# Patient Record
Sex: Male | Born: 1985 | Race: Black or African American | Hispanic: No | Marital: Single | Smoking: Never smoker
Health system: Southern US, Community
[De-identification: ages and names within clinical notes are randomized; demographics above are authoritative.]

## PROBLEM LIST (undated history)

## (undated) ENCOUNTER — Ambulatory Visit: Admission: EM | Payer: No Typology Code available for payment source | Source: Home / Self Care

## (undated) DIAGNOSIS — W3400XA Accidental discharge from unspecified firearms or gun, initial encounter: Secondary | ICD-10-CM

## (undated) HISTORY — PX: NO PAST SURGERIES: SHX2092

---
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
## 2004-02-16 ENCOUNTER — Emergency Department (HOSPITAL_COMMUNITY): Admission: EM | Admit: 2004-02-16 | Discharge: 2004-02-16 | Payer: Self-pay | Admitting: Emergency Medicine

## 2005-12-12 ENCOUNTER — Emergency Department (HOSPITAL_COMMUNITY): Admission: EM | Admit: 2005-12-12 | Discharge: 2005-12-12 | Payer: Self-pay | Admitting: Emergency Medicine

## 2009-07-17 ENCOUNTER — Emergency Department (HOSPITAL_COMMUNITY): Admission: EM | Admit: 2009-07-17 | Discharge: 2009-07-18 | Payer: Self-pay | Admitting: Emergency Medicine

## 2009-11-07 ENCOUNTER — Emergency Department (HOSPITAL_COMMUNITY): Admission: EM | Admit: 2009-11-07 | Discharge: 2009-11-07 | Payer: Self-pay | Admitting: Emergency Medicine

## 2011-03-21 LAB — GC/CHLAMYDIA PROBE AMP, GENITAL
Chlamydia, DNA Probe: POSITIVE — AB
GC Probe Amp, Genital: NEGATIVE

## 2011-03-25 LAB — GC/CHLAMYDIA PROBE AMP, GENITAL
Chlamydia, DNA Probe: NEGATIVE
GC Probe Amp, Genital: POSITIVE — AB

## 2011-09-26 ENCOUNTER — Emergency Department (HOSPITAL_COMMUNITY)
Admission: EM | Admit: 2011-09-26 | Discharge: 2011-09-26 | Disposition: A | Discharge status: Home / Self Care | Payer: Self-pay | Attending: Emergency Medicine | Admitting: Emergency Medicine

## 2011-09-26 DIAGNOSIS — L0231 Cutaneous abscess of buttock: Secondary | ICD-10-CM | POA: Insufficient documentation

## 2011-09-26 DIAGNOSIS — L03317 Cellulitis of buttock: Secondary | ICD-10-CM | POA: Insufficient documentation

## 2011-09-26 DIAGNOSIS — IMO0001 Reserved for inherently not codable concepts without codable children: Secondary | ICD-10-CM | POA: Insufficient documentation

## 2011-09-29 LAB — CULTURE, ROUTINE-ABSCESS

## 2015-11-23 ENCOUNTER — Encounter (HOSPITAL_COMMUNITY): Payer: Self-pay | Admitting: *Deleted

## 2015-11-23 ENCOUNTER — Emergency Department (HOSPITAL_COMMUNITY): Payer: Self-pay

## 2015-11-23 ENCOUNTER — Emergency Department (HOSPITAL_COMMUNITY)
Admission: EM | Admit: 2015-11-23 | Discharge: 2015-11-23 | Discharge status: Against Medical Advice | Payer: Self-pay | Attending: Emergency Medicine | Admitting: Emergency Medicine

## 2015-11-23 DIAGNOSIS — M7989 Other specified soft tissue disorders: Secondary | ICD-10-CM

## 2015-11-23 LAB — BASIC METABOLIC PANEL WITH GFR
Anion gap: 8 (ref 5–15)
BUN: 16 mg/dL (ref 6–20)
CO2: 28 mmol/L (ref 22–32)
Calcium: 9.5 mg/dL (ref 8.9–10.3)
Chloride: 104 mmol/L (ref 101–111)
Creatinine, Ser: 1.29 mg/dL — ABNORMAL HIGH (ref 0.61–1.24)
GFR calc Af Amer: >60 mL/min
GFR calc non Af Amer: >60 mL/min
Glucose, Bld: 95 mg/dL (ref 65–99)
Potassium: 4.3 mmol/L (ref 3.5–5.1)
Sodium: 140 mmol/L (ref 135–145)

## 2015-11-23 LAB — CBC WITH DIFFERENTIAL/PLATELET
Basophils Absolute: 0 K/uL (ref 0.0–0.1)
Basophils Relative: 0 %
Eosinophils Absolute: 0.2 K/uL (ref 0.0–0.7)
Eosinophils Relative: 3 %
HCT: 43.5 % (ref 39.0–52.0)
Hemoglobin: 14.4 g/dL (ref 13.0–17.0)
Lymphocytes Relative: 41 %
Lymphs Abs: 2.4 K/uL (ref 0.7–4.0)
MCH: 30.8 pg (ref 26.0–34.0)
MCHC: 33.1 g/dL (ref 30.0–36.0)
MCV: 93.1 fL (ref 78.0–100.0)
Monocytes Absolute: 0.3 K/uL (ref 0.1–1.0)
Monocytes Relative: 5 %
Neutro Abs: 3 K/uL (ref 1.7–7.7)
Neutrophils Relative %: 51 %
Platelets: 198 K/uL (ref 150–400)
RBC: 4.67 MIL/uL (ref 4.22–5.81)
RDW: 13.2 % (ref 11.5–15.5)
WBC: 5.9 K/uL (ref 4.0–10.5)

## 2015-11-23 NOTE — ED Notes (Signed)
Went in to round and pt has eloped. MD notified.

## 2015-11-23 NOTE — ED Provider Notes (Signed)
Patient seen/examined in the Emergency Department in conjunction with Midlevel Provider  Patient reports LE swelling Exam : awake/alert, +edema in left LE Plan: DVT study pending Pt otherwise stable    Zadie Rhineonald Yaretsi Humphres, MD 11/23/15 1154

## 2015-11-23 NOTE — ED Provider Notes (Signed)
CSN: [id number]     Arrival date & time 11/23/15  9562 History   First MD Initiated Contact with Patient 11/23/15 1003     Chief Complaint  Patient presents with  . Leg Swelling     (Consider location/radiation/quality/duration/timing/severity/associated sxs/prior Treatment) HPI   Kaya Klausing is a 29 y.o M with no sequela past medical history who presents to the emergency department today complaining of actually swelling. Patient states that he was easily incarcerated and released 2-1/2 weeks ago. Since then he has been eating fast food for every meal. He has noted left lotion recently swelling mostly around his calf and lower ankle over the last 2 weeks. The swelling has gotten progressively worse. Extremity is not painful. Denies chest pain, shortness of breath, discoloration, pale or cool extremity, nausea, vomiting, diarrhea, abdominal pain.   History reviewed. No pertinent past medical history. History reviewed. No pertinent past surgical history. No family history on file. Social History  Substance Use Topics  . Smoking status: Never Smoker   . Smokeless tobacco: Never Used  . Alcohol Use: 2.4 oz/week    4 Shots of liquor per week    Review of Systems  All other systems reviewed and are negative.     Allergies  Review of patient's allergies indicates no known allergies.  Home Medications   Prior to Admission medications   Not on File   BP 117/72 mmHg  Pulse 68  Temp(Src) 98.1 F (36.7 C) (Oral)  Resp 18  Ht 6' (1.829 m)  Wt 104.327 kg  BMI 31.19 kg/m2  SpO2 100% Physical Exam  Constitutional: He is oriented to person, place, and time. He appears well-developed and well-nourished. No distress.  HENT:  Head: Normocephalic and atraumatic.  Mouth/Throat: No oropharyngeal exudate.  Eyes: Conjunctivae and EOM are normal. Pupils are equal, round, and reactive to light. Right eye exhibits no discharge. Left eye exhibits no discharge. No scleral icterus.  Neck:  Neck supple.  Cardiovascular: Normal rate, regular rhythm, normal heart sounds and intact distal pulses.  Exam reveals no gallop and no friction rub.   No murmur heard. Pulmonary/Chest: Effort normal and breath sounds normal. No respiratory distress. He has no wheezes. He has no rales. He exhibits no tenderness.  Abdominal: Soft. He exhibits no distension. There is no tenderness. There is no guarding.  Musculoskeletal: Normal range of motion. He exhibits edema.  LLE edema. L>R. 1+ pitting up to mid shin. No erythema or warmth. No decrease ROM. No TTP.   Lymphadenopathy:    He has no cervical adenopathy.  Neurological: He is alert and oriented to person, place, and time. No cranial nerve deficit.  Strength 5/5 throughout. No sensory deficits.  No gait abnormality.   Skin: Skin is warm and dry. No rash noted. He is not diaphoretic. No erythema. No pallor.  Psychiatric: He has a normal mood and affect. His behavior is normal.  Nursing note and vitals reviewed.   ED Course  Procedures (including critical care time) Labs Review Labs Reviewed  BASIC METABOLIC PANEL - Abnormal; Notable for the following:    Creatinine, Ser 1.29 (*)    All other components within normal limits  CBC WITH DIFFERENTIAL/PLATELET    Imaging Review No results found. I have personally reviewed and evaluated these images and lab results as part of my medical decision-making.   EKG Interpretation None      MDM   Final diagnoses:  Leg swelling    Otherwise healthy 29 year old male presents with  lower extremity swelling, left greater than right over the last 2-1/2 weeks. Legs are nontender. No pallor, extremity. No warmth or erythema. No calf pain, chest pain or shortness of breath. Suspect swelling is due to fluid retention from consuming fast food for 3 meals a day for the last 3 weeks. However, will order LE venous U/S to r/o DVT.  Cr. Elevated at 1.29. All other lab work wnl.  While waiting on DVT  study pt eloped from room. Unable to find pt in ED.       Lester KinsmanSamantha Tripp [place]isure Village[place]ow[place], PA-C 11/23/15 1853  Zadie Rhineonald Wickline, MD 11/24/15 [phone]

## 2015-11-23 NOTE — ED Notes (Signed)
Pt c/o bilateral lower leg swelling that began 3 weeks ago. Pt denies injury.

## 2016-02-08 DIAGNOSIS — L03113 Cellulitis of right upper limb: Secondary | ICD-10-CM | POA: Insufficient documentation

## 2016-02-08 DIAGNOSIS — R42 Dizziness and giddiness: Secondary | ICD-10-CM | POA: Insufficient documentation

## 2016-02-08 DIAGNOSIS — L0231 Cutaneous abscess of buttock: Secondary | ICD-10-CM | POA: Insufficient documentation

## 2016-02-09 ENCOUNTER — Encounter (HOSPITAL_COMMUNITY): Payer: Self-pay | Admitting: Emergency Medicine

## 2016-02-09 ENCOUNTER — Emergency Department (HOSPITAL_COMMUNITY)
Admission: EM | Admit: 2016-02-09 | Discharge: 2016-02-09 | Disposition: A | Discharge status: Home / Self Care | Payer: Self-pay | Attending: Emergency Medicine | Admitting: Emergency Medicine

## 2016-02-09 DIAGNOSIS — L03113 Cellulitis of right upper limb: Secondary | ICD-10-CM

## 2016-02-09 DIAGNOSIS — L0231 Cutaneous abscess of buttock: Secondary | ICD-10-CM

## 2016-02-09 MED ORDER — CLINDAMYCIN HCL 300 MG PO CAPS: 300.0000 mg | ORAL_CAPSULE | Freq: Three times a day (TID) | ORAL | Status: DC

## 2016-02-09 MED ORDER — LIDOCAINE-EPINEPHRINE (PF) 2 %-1:200000 IJ SOLN: 10.0000 mL | Freq: Once | INTRAMUSCULAR | Status: DC

## 2016-02-09 MED ORDER — IBUPROFEN 600 MG PO TABS: 600.0000 mg | ORAL_TABLET | Freq: Four times a day (QID) | ORAL | Status: AC | PRN

## 2016-02-09 MED ORDER — CLINDAMYCIN HCL 150 MG PO CAPS
300.0000 mg | ORAL_CAPSULE | Freq: Once | ORAL | Status: AC
  Administered 2016-02-09: 300 mg via ORAL
  Filled 2016-02-09: qty 2

## 2016-02-09 MED ORDER — HYDROCODONE-ACETAMINOPHEN 5-325 MG PO TABS
1.0000 | ORAL_TABLET | Freq: Once | ORAL | Status: AC
  Administered 2016-02-09: 1 via ORAL
  Filled 2016-02-09: qty 1

## 2016-02-09 MED ORDER — HYDROCODONE-ACETAMINOPHEN 5-325 MG PO TABS: 1.0000 | ORAL_TABLET | ORAL | Status: DC | PRN

## 2016-02-09 MED ORDER — LIDOCAINE-EPINEPHRINE (PF) 2 %-1:200000 IJ SOLN
20.0000 mL | Freq: Once | INTRAMUSCULAR | Status: AC
  Administered 2016-02-09: 20 mL
  Filled 2016-02-09: qty 20

## 2016-02-09 NOTE — ED Notes (Signed)
MD at bedside. 

## 2016-02-09 NOTE — ED Notes (Signed)
Pt. presents with multiple small abscess at right forearm with drainage onset this week , pt. added recurring abscess at right buttocks with drainage onset 2 years ago . Denies fever or chills.

## 2016-02-09 NOTE — ED Notes (Signed)
Patient verbalized understanding of discharge instructions and denies any further needs or questions at this time. VS stable. Patient ambulatory with steady gait.  

## 2016-02-09 NOTE — Discharge Instructions (Signed)
Cellulitis °Cellulitis is an infection of the skin and the tissue beneath it. The infected area is usually red and tender. Cellulitis occurs most often in the arms and lower legs.  °CAUSES  °Cellulitis is caused by bacteria that enter the skin through cracks or cuts in the skin. The most common types of bacteria that cause cellulitis are staphylococci and streptococci. °SIGNS AND SYMPTOMS  °· Redness and warmth. °· Swelling. °· Tenderness or pain. °· Fever. °DIAGNOSIS  °Your health care provider can usually determine what is wrong based on a physical exam. Blood tests may also be done. °TREATMENT  °Treatment usually involves taking an antibiotic medicine. °HOME CARE INSTRUCTIONS  °· Take your antibiotic medicine as directed by your health care provider. Finish the antibiotic even if you start to feel better. °· Keep the infected arm or leg elevated to reduce swelling. °· Apply a warm cloth to the affected area up to 4 times per day to relieve pain. °· Take medicines only as directed by your health care provider. °· Keep all follow-up visits as directed by your health care provider. °SEEK MEDICAL CARE IF:  °· You notice red streaks coming from the infected area. °· Your red area gets larger or turns dark in color. °· Your bone or joint underneath the infected area becomes painful after the skin has healed. °· Your infection returns in the same area or another area. °· You notice a swollen bump in the infected area. °· You develop new symptoms. °· You have a fever. °SEEK IMMEDIATE MEDICAL CARE IF:  °· You feel very sleepy. °· You develop vomiting or diarrhea. °· You have a general ill feeling (malaise) with muscle aches and pains. °  °This information is not intended to replace advice given to you by your health care provider. Make sure you discuss any questions you have with your health care provider. °  °Document Released: 09/12/2005 Document Revised: 08/24/2015 Document Reviewed: 02/18/2012 °Elsevier Interactive  Patient Education ©2016 [employer] ° °Abscess °An abscess is an infected area that contains a collection of pus and debris. It can occur in almost any part of the body. An abscess is also known as a furuncle or boil. °CAUSES  °An abscess occurs when tissue gets infected. This can occur from blockage of oil or sweat glands, infection of hair follicles, or a minor injury to the skin. As the body tries to fight the infection, pus collects in the area and creates pressure under the skin. This pressure causes pain. People with weakened immune systems have difficulty fighting infections and get certain abscesses more often.  °SYMPTOMS °Usually an abscess develops on the skin and becomes a painful mass that is red, warm, and tender. If the abscess forms under the skin, you may feel a moveable soft area under the skin. Some abscesses break open (rupture) on their own, but most will continue to get worse without care. The infection can spread deeper into the body and eventually into the bloodstream, causing you to feel ill.  °DIAGNOSIS  °Your caregiver will take your medical history and perform a physical exam. A sample of fluid may also be taken from the abscess to determine what is causing your infection. °TREATMENT  °Your caregiver may prescribe antibiotic medicines to fight the infection. However, taking antibiotics alone usually does not cure an abscess. Your caregiver may need to make a small cut (incision) in the abscess to drain the pus. In some cases, gauze is packed into the abscess to reduce   pain and to continue draining the area. °HOME CARE INSTRUCTIONS  °· Only take over-the-counter or prescription medicines for pain, discomfort, or fever as directed by your caregiver. °· If you were prescribed antibiotics, take them as directed. Finish them even if you start to feel better. °· If gauze is used, follow your caregiver's directions for changing the gauze. °· To avoid spreading the infection: °¨ Keep your  draining abscess covered with a bandage. °¨ Wash your hands well. °¨ Do not share personal care items, towels, or whirlpools with others. °¨ Avoid skin contact with others. °· Keep your skin and clothes clean around the abscess. °· Keep all follow-up appointments as directed by your caregiver. °SEEK MEDICAL CARE IF:  °· You have increased pain, swelling, redness, fluid drainage, or bleeding. °· You have muscle aches, chills, or a general ill feeling. °· You have a fever. °MAKE SURE YOU:  °· Understand these instructions. °· Will watch your condition. °· Will get help right away if you are not doing well or get worse. °  °This information is not intended to replace advice given to you by your health care provider. Make sure you discuss any questions you have with your health care provider. °  °Document Released: 09/12/2005 Document Revised: 06/03/2012 Document Reviewed: 02/15/2012 °Elsevier Interactive Patient Education ©2016 Elsevier Inc. ° °

## 2016-02-09 NOTE — ED Provider Notes (Signed)
CSN: [id number]     Arrival date & time 02/08/16  2358 History   By signing my name below, I, Arlan Organ, attest that this documentation has been prepared under the direction and in the presence of Loren Racer, MD.  Electronically Signed: Arlan Organ, ED Scribe. 02/09/2016. 3:51 AM.   Chief Complaint  Patient presents with  . Abscess   The history is provided by the patient. No language interpreter was used.    HPI Comments: Thomas Webster is a 30 y.o. male without any pertinent past medical history who presents to the Emergency Department complaining of an area of worsening pain and redness to the R forearm x 2 days. Pain is exacerbated with pressure to the area. No alleviating factors at this time. Pt also reports some lightheadedness but denies fever or chills. No OTC medications or home remedies attempted prior to arrival. Pt denies any noticed insect or spider bites. He denies any recent IV drug use. No other members of the household with similar lesions. Patient also complains of recurrent "boil" to the right buttock. States this intermittently drains. States he's had multiple incisions of this abscess since childhood. PCP: No primary care provider on file.    History reviewed. No pertinent past medical history. History reviewed. No pertinent past surgical history. No family history on file. Social History  Substance Use Topics  . Smoking status: Never Smoker   . Smokeless tobacco: Never Used  . Alcohol Use: 2.4 oz/week    4 Shots of liquor per week    Review of Systems  Constitutional: Negative for fever and chills.  Respiratory: Negative for shortness of breath.   Cardiovascular: Negative for chest pain.  Gastrointestinal: Negative for nausea, vomiting and abdominal pain.  Musculoskeletal: Positive for arthralgias.  Skin: Positive for color change, rash and wound.  Neurological: Positive for light-headedness. Negative for weakness, numbness and headaches.   Psychiatric/Behavioral: Negative for confusion.  All other systems reviewed and are negative.     Allergies  Review of patient's allergies indicates no known allergies.  Home Medications   Prior to Admission medications   Medication Sig Start Date End Date Taking? Authorizing Provider  clindamycin (CLEOCIN) 300 MG capsule Take 1 capsule (300 mg total) by mouth 3 (three) times daily. 02/09/16   Loren Racer, MD  HYDROcodone-acetaminophen (NORCO) 5-325 MG tablet Take 1-2 tablets by mouth every 4 (four) hours as needed for severe pain. 02/09/16   Loren Racer, MD  ibuprofen (ADVIL,MOTRIN) 600 MG tablet Take 1 tablet (600 mg total) by mouth every 6 (six) hours as needed for moderate pain. 02/09/16   Loren Racer, MD   Triage Vitals: BP 119/58 mmHg  Pulse 74  Temp(Src) 97.4 F (36.3 C) (Oral)  Resp 16  SpO2 100%   Physical Exam  Constitutional: He is oriented to person, place, and time. He appears well-developed and well-nourished. No distress.  HENT:  Head: Normocephalic and atraumatic.  Mouth/Throat: Oropharynx is clear and moist. No oropharyngeal exudate.  Eyes: EOM are normal. Pupils are equal, round, and reactive to light.  Neck: Normal range of motion. Neck supple.  Cardiovascular: Normal rate and regular rhythm.   Pulmonary/Chest: Effort normal and breath sounds normal. No respiratory distress. He has no wheezes. He has no rales. He exhibits no tenderness.  Abdominal: Soft. Bowel sounds are normal. He exhibits no distension and no mass. There is no tenderness. There is no rebound and no guarding.  Genitourinary:  No definite fistula appreciated the patient does have some  drainage from the rectum.  Musculoskeletal: Normal range of motion. He exhibits edema and tenderness.  Right forearm swelling diffusely. There are 4 lesions with punctate centers and surrounding erythema located on the right forearm. The most prominent is just proximal to the right elbow. Surrounding  erythema roughly 4-5 cm in diameter. No fluctuant masses. There is some induration and warmth. Patient has full range of motion of the right elbow without discomfort. No effusion noted. 2+ distal pulses.   Neurological: He is alert and oriented to person, place, and time.  5/5 motor in all extremities. Sensation is fully intact.  Skin: Skin is warm and dry. No rash noted. He is not diaphoretic. No erythema.  Patient with fluctuant abscess roughly 2 cm in diameter to the gluteal cleft. Tenderness to palpation. No apparent drainage.  Psychiatric: He has a normal mood and affect. His behavior is normal.  Nursing note and vitals reviewed.   ED Course  .Marland KitchenIncision and Drainage Date/Time: 02/09/2016 5:46 AM Performed by: Loren Racer Authorized by: Ranae Palms, Eloise Mula Type: abscess Body area: anogenital Location details: perianal Local anesthetic: lidocaine 2% with epinephrine Anesthetic total: 3 ml Patient sedated: no Scalpel size: 11 Incision type: single straight Complexity: simple Drainage: purulent Drainage amount: moderate Wound treatment: wound left open Patient tolerance: Patient tolerated the procedure well with no immediate complications   (including critical care time)  DIAGNOSTIC STUDIES: Oxygen Saturation is 100% on RA, Normal by my interpretation.    COORDINATION OF CARE: 3:50 AM-Discussed treatment plan with pt at bedside and pt agreed to plan.     Labs Review Labs Reviewed - No data to display  Imaging Review No results found. I have personally reviewed and evaluated these images and lab results as part of my medical decision-making.   EKG Interpretation None      MDM   Final diagnoses:  Right forearm cellulitis  Gluteal abscess    I personally performed the services described in this documentation, which was scribed in my presence. The recorded information has been reviewed and is accurate.   Erythema was marked to the right forearm. Patient given  dose of clindamycin and hydrocodone in the emergency department. Incision and drainage of buttock abscess. Given recurrent nature we'll give surgery follow-up. Question possible fistula. Patient is instructed to return in 2 days to have the cellulitis on the right forearm reassess. He understands the need to return immediately for any obvious worsening, fever or chills.  Loren Racer, MD 02/09/16 [phone]

## 2016-09-21 ENCOUNTER — Emergency Department (HOSPITAL_COMMUNITY)
Admission: EM | Admit: 2016-09-21 | Discharge: 2016-09-21 | Disposition: A | Discharge status: Home / Self Care | Payer: Self-pay | Attending: Emergency Medicine | Admitting: Emergency Medicine

## 2016-09-21 ENCOUNTER — Emergency Department (HOSPITAL_COMMUNITY): Payer: Self-pay

## 2016-09-21 ENCOUNTER — Encounter (HOSPITAL_COMMUNITY): Payer: Self-pay

## 2016-09-21 DIAGNOSIS — Z791 Long term (current) use of non-steroidal anti-inflammatories (NSAID): Secondary | ICD-10-CM | POA: Insufficient documentation

## 2016-09-21 DIAGNOSIS — Y9259 Other trade areas as the place of occurrence of the external cause: Secondary | ICD-10-CM | POA: Insufficient documentation

## 2016-09-21 DIAGNOSIS — S0990XA Unspecified injury of head, initial encounter: Secondary | ICD-10-CM

## 2016-09-21 DIAGNOSIS — W228XXA Striking against or struck by other objects, initial encounter: Secondary | ICD-10-CM | POA: Insufficient documentation

## 2016-09-21 DIAGNOSIS — Y939 Activity, unspecified: Secondary | ICD-10-CM | POA: Insufficient documentation

## 2016-09-21 DIAGNOSIS — Y999 Unspecified external cause status: Secondary | ICD-10-CM | POA: Insufficient documentation

## 2016-09-21 DIAGNOSIS — S0101XA Laceration without foreign body of scalp, initial encounter: Secondary | ICD-10-CM | POA: Insufficient documentation

## 2016-09-21 NOTE — ED Provider Notes (Signed)
Ct head returned,  No fracture, no intracranial injury.   Re exam  Pt has a large abrasion scalp.   He does not want sutures.    Wound should heal   Pt counseled on wound care.   Lonia SkinnerLeslie K Norbourne EstatesS[place], PA-C 09/21/16 [id number]    Mancel BaleElliott Wentz, MD 09/23/16 1059    Mancel BaleElliott Wentz, MD 09/24/16 1409
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
## 2016-09-21 NOTE — ED Notes (Signed)
Bed: [id number] Expected date:  Expected time:  Means of arrival:  Comments: 30 yo M  ETOH, head injury
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
## 2016-09-21 NOTE — Discharge Instructions (Signed)
Return if any problems.

## 2017-08-20 ENCOUNTER — Encounter (HOSPITAL_COMMUNITY): Payer: Self-pay | Admitting: Emergency Medicine

## 2017-08-20 ENCOUNTER — Encounter (HOSPITAL_COMMUNITY): Payer: Self-pay | Admitting: Certified Registered"

## 2017-08-20 ENCOUNTER — Emergency Department (HOSPITAL_COMMUNITY): Payer: Self-pay

## 2017-08-20 ENCOUNTER — Observation Stay (HOSPITAL_COMMUNITY)
Admission: EM | Admit: 2017-08-20 | Discharge: 2017-08-21 | Disposition: A | Discharge status: Home / Self Care | Payer: Self-pay | Attending: Orthopedic Surgery | Admitting: Orthopedic Surgery

## 2017-08-20 DIAGNOSIS — S82244A Nondisplaced spiral fracture of shaft of right tibia, initial encounter for closed fracture: Secondary | ICD-10-CM

## 2017-08-20 DIAGNOSIS — Z7982 Long term (current) use of aspirin: Secondary | ICD-10-CM | POA: Insufficient documentation

## 2017-08-20 DIAGNOSIS — S82201A Unspecified fracture of shaft of right tibia, initial encounter for closed fracture: Principal | ICD-10-CM | POA: Insufficient documentation

## 2017-08-20 DIAGNOSIS — S82254A Nondisplaced comminuted fracture of shaft of right tibia, initial encounter for closed fracture: Secondary | ICD-10-CM

## 2017-08-20 DIAGNOSIS — W3400XA Accidental discharge from unspecified firearms or gun, initial encounter: Secondary | ICD-10-CM | POA: Insufficient documentation

## 2017-08-20 DIAGNOSIS — Z79899 Other long term (current) drug therapy: Secondary | ICD-10-CM | POA: Insufficient documentation

## 2017-08-20 DIAGNOSIS — S82209A Unspecified fracture of shaft of unspecified tibia, initial encounter for closed fracture: Secondary | ICD-10-CM | POA: Diagnosis present

## 2017-08-20 DIAGNOSIS — S81831A Puncture wound without foreign body, right lower leg, initial encounter: Secondary | ICD-10-CM

## 2017-08-20 HISTORY — DX: Accidental discharge from unspecified firearms or gun, initial encounter: W34.00XA

## 2017-08-20 LAB — CBC
HEMATOCRIT: 42 % (ref 39.0–52.0)
Hemoglobin: 13.8 g/dL (ref 13.0–17.0)
MCH: 29.7 pg (ref 26.0–34.0)
MCHC: 32.9 g/dL (ref 30.0–36.0)
MCV: 90.5 fL (ref 78.0–100.0)
PLATELETS: 228 10*3/uL (ref 150–400)
RBC: 4.64 MIL/uL (ref 4.22–5.81)
RDW: 13.6 % (ref 11.5–15.5)
WBC: 8.1 10*3/uL (ref 4.0–10.5)

## 2017-08-20 LAB — BASIC METABOLIC PANEL
Anion gap: 9 (ref 5–15)
BUN: 17 mg/dL (ref 6–20)
CHLORIDE: 100 mmol/L — AB (ref 101–111)
CO2: 27 mmol/L (ref 22–32)
CREATININE: 1.63 mg/dL — AB (ref 0.61–1.24)
Calcium: 9.1 mg/dL (ref 8.9–10.3)
GFR calc Af Amer: >60 mL/min (ref >60)
GFR calc non Af Amer: 54 mL/min — ABNORMAL LOW (ref >60)
GLUCOSE: 117 mg/dL — AB (ref 65–99)
POTASSIUM: 3.7 mmol/L (ref 3.5–5.1)
SODIUM: 136 mmol/L (ref 135–145)

## 2017-08-20 MED ORDER — MORPHINE SULFATE (PF) 4 MG/ML IV SOLN
6.0000 mg | Freq: Once | INTRAVENOUS | Status: AC
  Administered 2017-08-21: 6 mg via INTRAVENOUS

## 2017-08-20 MED ORDER — DOCUSATE SODIUM 100 MG PO CAPS
100.0000 mg | ORAL_CAPSULE | Freq: Two times a day (BID) | ORAL | Status: DC
  Administered 2017-08-21: 100 mg via ORAL
  Filled 2017-08-20: qty 1

## 2017-08-20 MED ORDER — METHOCARBAMOL 500 MG PO TABS
500.0000 mg | ORAL_TABLET | Freq: Four times a day (QID) | ORAL | Status: DC | PRN
  Administered 2017-08-21 (×2): 500 mg via ORAL
  Filled 2017-08-20 (×2): qty 1

## 2017-08-20 MED ORDER — FENTANYL CITRATE (PF) 100 MCG/2ML IJ SOLN
INTRAMUSCULAR | Status: AC
  Filled 2017-08-20: qty 2

## 2017-08-20 MED ORDER — CELECOXIB 200 MG PO CAPS
200.0000 mg | ORAL_CAPSULE | Freq: Two times a day (BID) | ORAL | Status: DC
  Administered 2017-08-21 (×2): 200 mg via ORAL
  Filled 2017-08-20 (×2): qty 1

## 2017-08-20 MED ORDER — MORPHINE SULFATE (PF) 4 MG/ML IV SOLN
4.0000 mg | Freq: Once | INTRAVENOUS | Status: AC
  Administered 2017-08-20: 4 mg via INTRAVENOUS

## 2017-08-20 MED ORDER — FENTANYL CITRATE (PF) 250 MCG/5ML IJ SOLN
INTRAMUSCULAR | Status: AC
  Filled 2017-08-20: qty 5

## 2017-08-20 MED ORDER — CEFAZOLIN SODIUM-DEXTROSE 1-4 GM/50ML-% IV SOLN
1.0000 g | Freq: Once | INTRAVENOUS | Status: AC
  Administered 2017-08-20: 1 g via INTRAVENOUS
  Filled 2017-08-20: qty 50

## 2017-08-20 MED ORDER — MIDAZOLAM HCL 2 MG/2ML IJ SOLN
INTRAMUSCULAR | Status: AC
  Filled 2017-08-20: qty 2

## 2017-08-20 MED ORDER — PROPOFOL 10 MG/ML IV BOLUS
INTRAVENOUS | Status: AC
  Filled 2017-08-20: qty 20

## 2017-08-20 MED ORDER — SODIUM CHLORIDE 0.9 % IV BOLUS (SEPSIS)
1000.0000 mL | Freq: Once | INTRAVENOUS | Status: AC
  Administered 2017-08-20: 1000 mL via INTRAVENOUS

## 2017-08-20 MED ORDER — FENTANYL CITRATE (PF) 100 MCG/2ML IJ SOLN
100.0000 ug | Freq: Once | INTRAMUSCULAR | Status: AC
  Administered 2017-08-20: 100 ug via INTRAVENOUS

## 2017-08-20 MED ORDER — METHOCARBAMOL 1000 MG/10ML IJ SOLN
500.0000 mg | Freq: Four times a day (QID) | INTRAVENOUS | Status: DC | PRN
  Filled 2017-08-20: qty 5

## 2017-08-20 MED ORDER — MORPHINE SULFATE (PF) 4 MG/ML IV SOLN
4.0000 mg | INTRAVENOUS | Status: DC | PRN
  Administered 2017-08-21 (×2): 4 mg via INTRAVENOUS
  Filled 2017-08-20 (×2): qty 1

## 2017-08-20 MED ORDER — MORPHINE SULFATE (PF) 4 MG/ML IV SOLN
4.0000 mg | INTRAVENOUS | Status: DC | PRN
  Administered 2017-08-20: 4 mg via INTRAVENOUS
  Filled 2017-08-20 (×2): qty 1

## 2017-08-20 MED ORDER — ACETAMINOPHEN 650 MG RE SUPP: 650.0000 mg | Freq: Four times a day (QID) | RECTAL | Status: DC | PRN

## 2017-08-20 MED ORDER — ACETAMINOPHEN 325 MG PO TABS: 650.0000 mg | ORAL_TABLET | Freq: Four times a day (QID) | ORAL | Status: DC | PRN

## 2017-08-20 MED ORDER — CEFAZOLIN SODIUM-DEXTROSE 2-4 GM/100ML-% IV SOLN: 2.0000 g | Freq: Once | INTRAVENOUS | Status: DC

## 2017-08-20 MED ORDER — OXYCODONE HCL 5 MG PO TABS
5.0000 mg | ORAL_TABLET | ORAL | Status: DC | PRN
  Administered 2017-08-21 (×2): 10 mg via ORAL
  Filled 2017-08-20 (×2): qty 2

## 2017-08-20 NOTE — ED Notes (Signed)
Explained XXX policy to both patient and family; password of [phone] given. Both parties expressed understanding

## 2017-08-20 NOTE — ED Notes (Signed)
Dr August Saucerean at bedside site marked and consent signed
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
## 2017-08-21 ENCOUNTER — Encounter (HOSPITAL_COMMUNITY)
Admission: EM | Disposition: A | Discharge status: Home / Self Care | Payer: Self-pay | Source: Home / Self Care | Attending: Emergency Medicine

## 2017-08-21 ENCOUNTER — Encounter (HOSPITAL_COMMUNITY): Payer: Self-pay

## 2017-08-21 ENCOUNTER — Encounter (HOSPITAL_COMMUNITY): Payer: Self-pay | Admitting: General Practice

## 2017-08-21 LAB — HIV ANTIBODY (ROUTINE TESTING W REFLEX): HIV Screen 4th Generation wRfx: NONREACTIVE

## 2017-08-21 SURGERY — INSERTION, INTRAMEDULLARY ROD, TIBIA
Anesthesia: General | Laterality: Right

## 2017-08-21 MED ORDER — ASPIRIN EC 325 MG PO TBEC: 325.0000 mg | DELAYED_RELEASE_TABLET | Freq: Every day | ORAL | 0 refills | Status: DC

## 2017-08-21 MED ORDER — ACETAMINOPHEN 325 MG PO TABS: 650.0000 mg | ORAL_TABLET | Freq: Four times a day (QID) | ORAL | 0 refills | Status: AC | PRN

## 2017-08-21 MED ORDER — METHOCARBAMOL 500 MG PO TABS: 500.0000 mg | ORAL_TABLET | Freq: Four times a day (QID) | ORAL | 0 refills | Status: DC | PRN

## 2017-08-21 MED ORDER — CEFAZOLIN SODIUM-DEXTROSE 2-4 GM/100ML-% IV SOLN
2.0000 g | Freq: Once | INTRAVENOUS | Status: AC
  Administered 2017-08-21: 2 g via INTRAVENOUS
  Filled 2017-08-21: qty 100

## 2017-08-21 MED ORDER — MORPHINE SULFATE (PF) 4 MG/ML IV SOLN
INTRAVENOUS | Status: AC
  Filled 2017-08-21: qty 2

## 2017-08-21 MED ORDER — OXYCODONE HCL 5 MG PO TABS: 5.0000 mg | ORAL_TABLET | ORAL | 0 refills | Status: DC | PRN

## 2017-08-21 NOTE — Progress Notes (Signed)
Orthopedic Tech Progress Note Patient Details:  Thomas PelKevin XXXPearson Jr. 1986-11-27 [id number]  Ortho Devices Type of Ortho Device: Post (long leg) splint Ortho Device/Splint Location: rle. applied as per drs verbal order. Ortho Device/Splint Interventions: Ordered, Application   Thomas Webster, [name] J 08/21/2017, 12:49 AM
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
## 2017-08-21 NOTE — Progress Notes (Signed)
Orthopedic Tech Progress Note Patient Details:  Thomas PelKevin XXXPearson Jr. [birth year] [id number] Viewed order from doctor's order list Ortho Devices Type of Ortho Device: Crutches Ortho Device/Splint Location: rle. applied as per drs verbal order. Ortho Device/Splint Interventions: Application   [name] 08/21/2017, 11:50 AM

## 2017-08-21 NOTE — Progress Notes (Signed)
Patient stable at this time.  Pain is controlled.  Toe dorsiflexion plantar flexion intact without pain.  Foot is perfused and sensate.  Rotational alignment appears approximately symmetric with the left-hand side  Compartments soft to palpation  Long-leg splint in position  Plan is for physical therapy assessment nonweightbearing right lower extremity with discharge home today.  We will put him on pain medicine and muscle relaxers and aspirin for DVT prophylaxis.  Follow-up with me on Friday for long-leg casting.  If the fracture moves we will have to proceed with intramedullary nailing.  Nonweightbearing is stressed to the patient.  He should be okay to discharge at noon today.

## 2017-08-23 ENCOUNTER — Encounter (HOSPITAL_COMMUNITY): Payer: Self-pay | Admitting: *Deleted

## 2017-08-23 ENCOUNTER — Ambulatory Visit (INDEPENDENT_AMBULATORY_CARE_PROVIDER_SITE_OTHER): Payer: Self-pay | Admitting: Orthopedic Surgery

## 2017-08-23 ENCOUNTER — Encounter (INDEPENDENT_AMBULATORY_CARE_PROVIDER_SITE_OTHER): Payer: Self-pay | Admitting: Orthopedic Surgery

## 2017-08-23 ENCOUNTER — Ambulatory Visit (INDEPENDENT_AMBULATORY_CARE_PROVIDER_SITE_OTHER): Payer: Self-pay

## 2017-08-23 ENCOUNTER — Emergency Department (HOSPITAL_COMMUNITY)
Admission: EM | Admit: 2017-08-23 | Discharge: 2017-08-23 | Disposition: A | Discharge status: Home / Self Care | Payer: Self-pay | Attending: Emergency Medicine | Admitting: Emergency Medicine

## 2017-08-23 DIAGNOSIS — M79661 Pain in right lower leg: Secondary | ICD-10-CM | POA: Insufficient documentation

## 2017-08-23 DIAGNOSIS — Z7982 Long term (current) use of aspirin: Secondary | ICD-10-CM | POA: Insufficient documentation

## 2017-08-23 DIAGNOSIS — S82401A Unspecified fracture of shaft of right fibula, initial encounter for closed fracture: Secondary | ICD-10-CM

## 2017-08-23 DIAGNOSIS — S82201A Unspecified fracture of shaft of right tibia, initial encounter for closed fracture: Secondary | ICD-10-CM

## 2017-08-23 DIAGNOSIS — M79605 Pain in left leg: Secondary | ICD-10-CM

## 2017-08-23 MED ORDER — OXYCODONE-ACETAMINOPHEN 5-325 MG PO TABS
1.0000 | ORAL_TABLET | Freq: Once | ORAL | Status: AC
Start: 2017-08-23 — End: 2017-08-23
  Administered 2017-08-23: 1 via ORAL
  Filled 2017-08-23: qty 1

## 2017-08-23 NOTE — ED Notes (Signed)
Spoke with Ortho who will apply a splint on patient's right leg.
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
## 2017-08-23 NOTE — ED Notes (Signed)
Ortho at bedside.

## 2017-08-23 NOTE — Progress Notes (Signed)
Orthopedic Tech Progress Note Patient Details:  Fayrene HelperKevin Wong [birth year] [id number]  Ortho Devices Type of Ortho Device: Ace wrap, Post (short leg) splint Ortho Device/Splint Location: rle Ortho Device/Splint Interventions: Application   Dhaval Woo 08/23/2017, 10:09 AM
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
## 2017-08-23 NOTE — Discharge Instructions (Signed)
Go to see Dr. August Saucerean as soon as you leave the emergency room
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
## 2017-09-12 ENCOUNTER — Encounter (INDEPENDENT_AMBULATORY_CARE_PROVIDER_SITE_OTHER): Payer: Self-pay | Admitting: Orthopedic Surgery

## 2017-09-12 ENCOUNTER — Telehealth (INDEPENDENT_AMBULATORY_CARE_PROVIDER_SITE_OTHER): Payer: Self-pay

## 2017-09-12 ENCOUNTER — Ambulatory Visit (HOSPITAL_COMMUNITY): Admission: RE | Admit: 2017-09-12 | Payer: Self-pay | Source: Ambulatory Visit

## 2017-09-12 ENCOUNTER — Ambulatory Visit (INDEPENDENT_AMBULATORY_CARE_PROVIDER_SITE_OTHER): Payer: Self-pay

## 2017-09-12 ENCOUNTER — Ambulatory Visit (INDEPENDENT_AMBULATORY_CARE_PROVIDER_SITE_OTHER): Payer: Self-pay | Admitting: Orthopedic Surgery

## 2017-09-12 DIAGNOSIS — S82201D Unspecified fracture of shaft of right tibia, subsequent encounter for closed fracture with routine healing: Secondary | ICD-10-CM

## 2017-09-12 MED ORDER — HYDROCODONE-ACETAMINOPHEN 5-325 MG PO TABS: 1.0000 | ORAL_TABLET | Freq: Three times a day (TID) | ORAL | 0 refills | Status: DC | PRN

## 2017-09-12 NOTE — Telephone Encounter (Signed)
Patient was seen in office today. Long leg cast d/c'd. We placed patient in fx boot and per Dr August Saucer he was scheduled for U/S doppler r/o DVT at [facility] vascular lab at 1pm. Patient advised to arrive at 1245pm.  Received call from Lupita Leash at [facility] Vascular lab today at 120pm and she advised [place] that as of 120pm patient had not showed up for doppler study.

## 2017-09-12 NOTE — Telephone Encounter (Signed)
Duly noted.  My index of suspicion is low.  I did tell him to resume aspirin.  I did explain the risk and benefits of not getting this procedure done and why we are doing it.
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
## 2017-09-13 ENCOUNTER — Ambulatory Visit (INDEPENDENT_AMBULATORY_CARE_PROVIDER_SITE_OTHER): Payer: Self-pay | Admitting: Orthopedic Surgery
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
## 2017-10-03 ENCOUNTER — Ambulatory Visit (INDEPENDENT_AMBULATORY_CARE_PROVIDER_SITE_OTHER): Payer: Self-pay | Admitting: Orthopedic Surgery

## 2017-10-03 ENCOUNTER — Ambulatory Visit (INDEPENDENT_AMBULATORY_CARE_PROVIDER_SITE_OTHER): Payer: Self-pay

## 2017-10-03 ENCOUNTER — Encounter (INDEPENDENT_AMBULATORY_CARE_PROVIDER_SITE_OTHER): Payer: Self-pay | Admitting: Orthopedic Surgery

## 2017-10-03 DIAGNOSIS — S82201D Unspecified fracture of shaft of right tibia, subsequent encounter for closed fracture with routine healing: Secondary | ICD-10-CM

## 2017-10-03 MED ORDER — HYDROCODONE-ACETAMINOPHEN 5-325 MG PO TABS: 1.0000 | ORAL_TABLET | Freq: Three times a day (TID) | ORAL | 0 refills | Status: DC | PRN
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
## 2017-11-06 ENCOUNTER — Ambulatory Visit (INDEPENDENT_AMBULATORY_CARE_PROVIDER_SITE_OTHER): Payer: Self-pay | Admitting: Orthopedic Surgery

## 2017-11-14 ENCOUNTER — Telehealth (INDEPENDENT_AMBULATORY_CARE_PROVIDER_SITE_OTHER): Payer: Self-pay | Admitting: Orthopedic Surgery

## 2017-11-14 MED ORDER — TRAMADOL HCL 50 MG PO TABS: 50.0000 mg | ORAL_TABLET | Freq: Two times a day (BID) | ORAL | 0 refills | Status: DC

## 2017-11-14 NOTE — Telephone Encounter (Signed)
Ok to refill 

## 2017-11-14 NOTE — Telephone Encounter (Signed)
IC number with message and male answered and told me this was not his phone number.  I called pharmacy and LMVM with the tramadol Rx.

## 2017-11-14 NOTE — Telephone Encounter (Signed)
Ok for ultram 1 po bid # 35 pls clal thx

## 2017-11-14 NOTE — Telephone Encounter (Signed)
Thomas Webster,[name] 1986/03/24    Med refill  Hydrocodone-acetaminophen 5-325 MG tablet

## 2017-11-20 ENCOUNTER — Encounter (INDEPENDENT_AMBULATORY_CARE_PROVIDER_SITE_OTHER): Payer: Self-pay

## 2017-11-20 ENCOUNTER — Ambulatory Visit (INDEPENDENT_AMBULATORY_CARE_PROVIDER_SITE_OTHER): Payer: Self-pay | Admitting: Orthopedic Surgery

## 2018-01-03 ENCOUNTER — Emergency Department (HOSPITAL_COMMUNITY): Payer: Self-pay

## 2018-01-03 ENCOUNTER — Inpatient Hospital Stay (HOSPITAL_COMMUNITY)
Admission: EM | Admit: 2018-01-03 | Discharge: 2018-01-06 | DRG: 494 | Disposition: A | Discharge status: Home / Self Care | Payer: Self-pay | Attending: Orthopaedic Surgery | Admitting: Orthopaedic Surgery

## 2018-01-03 ENCOUNTER — Other Ambulatory Visit: Payer: Self-pay

## 2018-01-03 ENCOUNTER — Encounter (HOSPITAL_COMMUNITY): Payer: Self-pay

## 2018-01-03 DIAGNOSIS — Z419 Encounter for procedure for purposes other than remedying health state, unspecified: Secondary | ICD-10-CM

## 2018-01-03 DIAGNOSIS — X58XXXA Exposure to other specified factors, initial encounter: Secondary | ICD-10-CM | POA: Diagnosis present

## 2018-01-03 DIAGNOSIS — Z9889 Other specified postprocedural states: Secondary | ICD-10-CM

## 2018-01-03 DIAGNOSIS — S82831A Other fracture of upper and lower end of right fibula, initial encounter for closed fracture: Secondary | ICD-10-CM

## 2018-01-03 DIAGNOSIS — S82251A Displaced comminuted fracture of shaft of right tibia, initial encounter for closed fracture: Principal | ICD-10-CM | POA: Diagnosis present

## 2018-01-03 DIAGNOSIS — F10129 Alcohol abuse with intoxication, unspecified: Secondary | ICD-10-CM | POA: Diagnosis present

## 2018-01-03 DIAGNOSIS — W19XXXA Unspecified fall, initial encounter: Secondary | ICD-10-CM

## 2018-01-03 DIAGNOSIS — S82101A Unspecified fracture of upper end of right tibia, initial encounter for closed fracture: Secondary | ICD-10-CM

## 2018-01-03 DIAGNOSIS — S82201A Unspecified fracture of shaft of right tibia, initial encounter for closed fracture: Secondary | ICD-10-CM | POA: Diagnosis present

## 2018-01-03 DIAGNOSIS — Y9372 Activity, wrestling: Secondary | ICD-10-CM

## 2018-01-03 DIAGNOSIS — S82451A Displaced comminuted fracture of shaft of right fibula, initial encounter for closed fracture: Secondary | ICD-10-CM | POA: Diagnosis present

## 2018-01-03 NOTE — ED Notes (Signed)
Bed: WTR7 Expected date:  Expected time:  Means of arrival:  Comments: 
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
## 2018-01-04 DIAGNOSIS — S82201A Unspecified fracture of shaft of right tibia, initial encounter for closed fracture: Secondary | ICD-10-CM | POA: Diagnosis present

## 2018-01-04 DIAGNOSIS — S82401A Unspecified fracture of shaft of right fibula, initial encounter for closed fracture: Secondary | ICD-10-CM

## 2018-01-04 LAB — BASIC METABOLIC PANEL
Anion gap: 10 (ref 5–15)
BUN: 17 mg/dL (ref 6–20)
CHLORIDE: 103 mmol/L (ref 101–111)
CO2: 24 mmol/L (ref 22–32)
CREATININE: 1.36 mg/dL — AB (ref 0.61–1.24)
Calcium: 8.8 mg/dL — ABNORMAL LOW (ref 8.9–10.3)
Glucose, Bld: 94 mg/dL (ref 65–99)
POTASSIUM: 3.9 mmol/L (ref 3.5–5.1)
SODIUM: 137 mmol/L (ref 135–145)

## 2018-01-04 LAB — CBC
HCT: 44.4 % (ref 39.0–52.0)
HEMOGLOBIN: 15.2 g/dL (ref 13.0–17.0)
MCH: 29.7 pg (ref 26.0–34.0)
MCHC: 34.2 g/dL (ref 30.0–36.0)
MCV: 86.9 fL (ref 78.0–100.0)
PLATELETS: 245 10*3/uL (ref 150–400)
RBC: 5.11 MIL/uL (ref 4.22–5.81)
RDW: 14.3 % (ref 11.5–15.5)
WBC: 9.8 10*3/uL (ref 4.0–10.5)

## 2018-01-04 LAB — PROTIME-INR
INR: 0.98
Prothrombin Time: 12.9 seconds (ref 11.4–15.2)

## 2018-01-04 LAB — APTT: APTT: 28 s (ref 24–36)

## 2018-01-04 LAB — SURGICAL PCR SCREEN
MRSA, PCR: NEGATIVE
Staphylococcus aureus: NEGATIVE

## 2018-01-04 LAB — ETHANOL: ALCOHOL ETHYL (B): 222 mg/dL — AB (ref <10)

## 2018-01-04 MED ORDER — METHOCARBAMOL 500 MG PO TABS
500.0000 mg | ORAL_TABLET | Freq: Four times a day (QID) | ORAL | Status: DC | PRN
  Administered 2018-01-04: 500 mg via ORAL
  Filled 2018-01-04: qty 1

## 2018-01-04 MED ORDER — METHOCARBAMOL 1000 MG/10ML IJ SOLN
500.0000 mg | Freq: Four times a day (QID) | INTRAVENOUS | Status: DC | PRN
  Filled 2018-01-04: qty 5

## 2018-01-04 MED ORDER — MORPHINE SULFATE (PF) 4 MG/ML IV SOLN
4.0000 mg | INTRAVENOUS | Status: DC | PRN
Start: 2018-01-04 — End: 2018-01-06
  Administered 2018-01-04 – 2018-01-06 (×8): 4 mg via INTRAVENOUS
  Filled 2018-01-04 (×8): qty 1

## 2018-01-04 MED ORDER — ACETAMINOPHEN 650 MG RE SUPP: 650.0000 mg | Freq: Four times a day (QID) | RECTAL | Status: DC | PRN

## 2018-01-04 MED ORDER — SODIUM CHLORIDE 0.9 % IV BOLUS (SEPSIS)
1000.0000 mL | Freq: Once | INTRAVENOUS | Status: AC
  Administered 2018-01-04: 1000 mL via INTRAVENOUS

## 2018-01-04 MED ORDER — ACETAMINOPHEN 325 MG PO TABS: 650.0000 mg | ORAL_TABLET | Freq: Four times a day (QID) | ORAL | Status: DC | PRN

## 2018-01-04 NOTE — ED Notes (Signed)
Pt's friend: Debbe OdeaLatisha [phone] would like to be called at transport.

## 2018-01-04 NOTE — ED Notes (Signed)
Report given to Roj, RN.  And call placed to [facility]

## 2018-01-04 NOTE — Plan of Care (Signed)
  Coping: Level of anxiety will decrease 01/04/2018 1808 - Progressing by Darrow BussingArcilla, Drelyn Pistilli M, RN   Elimination: Will not experience complications related to bowel motility 01/04/2018 1808 - Progressing by Darrow BussingArcilla, Raney Koeppen M, RN   Pain Managment: General experience of comfort will improve 01/04/2018 1808 - Progressing by Darrow BussingArcilla, Sueko Dimichele M, RN   Safety: Ability to remain free from injury will improve 01/04/2018 1808 - Progressing by Darrow BussingArcilla, Lewis Grivas M, RN

## 2018-01-04 NOTE — ED Notes (Signed)
[facility]  Will call back.
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
## 2018-01-04 NOTE — H&P (Signed)
See consult note

## 2018-01-04 NOTE — ED Notes (Signed)
Patient was explained he should not eat or drink due to pending surgery, patient was furious that he gets something to eat/drink.  He was aware it could hold up his surgery
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
## 2018-01-05 ENCOUNTER — Inpatient Hospital Stay (HOSPITAL_COMMUNITY): Payer: Self-pay | Admitting: Anesthesiology

## 2018-01-05 ENCOUNTER — Encounter (HOSPITAL_COMMUNITY)
Admission: EM | Disposition: A | Discharge status: Home / Self Care | Payer: Self-pay | Source: Home / Self Care | Attending: Orthopaedic Surgery

## 2018-01-05 ENCOUNTER — Inpatient Hospital Stay (HOSPITAL_COMMUNITY): Payer: Self-pay

## 2018-01-05 HISTORY — PX: TIBIA IM NAIL INSERTION: SHX2516

## 2018-01-05 SURGERY — INSERTION, INTRAMEDULLARY ROD, TIBIA
Anesthesia: General | Site: Leg Upper | Laterality: Right

## 2018-01-05 MED ORDER — METHOCARBAMOL 750 MG PO TABS: 750.0000 mg | ORAL_TABLET | Freq: Two times a day (BID) | ORAL | 0 refills | Status: AC | PRN

## 2018-01-05 MED ORDER — METOCLOPRAMIDE HCL 5 MG/ML IJ SOLN: 5.0000 mg | Freq: Three times a day (TID) | INTRAMUSCULAR | Status: DC | PRN

## 2018-01-05 MED ORDER — CEFAZOLIN SODIUM-DEXTROSE 2-4 GM/100ML-% IV SOLN
2.0000 g | Freq: Four times a day (QID) | INTRAVENOUS | Status: AC
  Administered 2018-01-05 – 2018-01-06 (×3): 2 g via INTRAVENOUS
  Filled 2018-01-05 (×3): qty 100

## 2018-01-05 MED ORDER — MIDAZOLAM HCL 2 MG/2ML IJ SOLN
INTRAMUSCULAR | Status: AC
  Filled 2018-01-05: qty 2

## 2018-01-05 MED ORDER — CEFAZOLIN SODIUM-DEXTROSE 1-4 GM/50ML-% IV SOLN
INTRAVENOUS | Status: DC | PRN
  Administered 2018-01-05: 3 g via INTRAVENOUS

## 2018-01-05 MED ORDER — METOCLOPRAMIDE HCL 5 MG PO TABS: 5.0000 mg | ORAL_TABLET | Freq: Three times a day (TID) | ORAL | Status: DC | PRN

## 2018-01-05 MED ORDER — METHOCARBAMOL 500 MG PO TABS
ORAL_TABLET | ORAL | Status: AC
Start: 2018-01-05 — End: 2018-01-06
  Filled 2018-01-05: qty 1

## 2018-01-05 MED ORDER — SODIUM CHLORIDE 0.9 % IV SOLN
INTRAVENOUS | Status: DC
  Administered 2018-01-05: 13:00:00 via INTRAVENOUS

## 2018-01-05 MED ORDER — HYDROMORPHONE HCL 1 MG/ML IJ SOLN
INTRAMUSCULAR | Status: AC
  Filled 2018-01-05: qty 1

## 2018-01-05 MED ORDER — OXYCODONE HCL 5 MG/5ML PO SOLN: 5.0000 mg | Freq: Once | ORAL | Status: AC | PRN

## 2018-01-05 MED ORDER — SODIUM CHLORIDE 0.9 % IR SOLN
Status: DC | PRN
  Administered 2018-01-05: 3000 mL

## 2018-01-05 MED ORDER — OXYCODONE HCL 5 MG PO TABS
5.0000 mg | ORAL_TABLET | Freq: Once | ORAL | Status: AC | PRN
  Administered 2018-01-05: 5 mg via ORAL

## 2018-01-05 MED ORDER — FENTANYL CITRATE (PF) 100 MCG/2ML IJ SOLN
INTRAMUSCULAR | Status: DC | PRN
  Administered 2018-01-05: 25 ug via INTRAVENOUS
  Administered 2018-01-05: 50 ug via INTRAVENOUS
  Administered 2018-01-05: 25 ug via INTRAVENOUS
  Administered 2018-01-05: 250 ug via INTRAVENOUS
  Administered 2018-01-05 (×2): 25 ug via INTRAVENOUS

## 2018-01-05 MED ORDER — ONDANSETRON HCL 4 MG/2ML IJ SOLN: 4.0000 mg | Freq: Four times a day (QID) | INTRAMUSCULAR | Status: DC | PRN

## 2018-01-05 MED ORDER — METHOCARBAMOL 500 MG PO TABS
500.0000 mg | ORAL_TABLET | Freq: Four times a day (QID) | ORAL | Status: DC | PRN
  Administered 2018-01-05: 500 mg via ORAL
  Filled 2018-01-05 (×2): qty 1

## 2018-01-05 MED ORDER — HYDRALAZINE HCL 20 MG/ML IJ SOLN
5.0000 mg | Freq: Once | INTRAMUSCULAR | Status: AC
  Administered 2018-01-05: 10 mg via INTRAVENOUS

## 2018-01-05 MED ORDER — HYDROMORPHONE HCL 1 MG/ML IJ SOLN
INTRAMUSCULAR | Status: AC
  Filled 2018-01-05: qty 0.5

## 2018-01-05 MED ORDER — HYDROMORPHONE HCL 1 MG/ML IJ SOLN
INTRAMUSCULAR | Status: DC | PRN
  Administered 2018-01-05 (×2): 0.5 mg via INTRAVENOUS

## 2018-01-05 MED ORDER — ASPIRIN EC 325 MG PO TBEC: 325.0000 mg | DELAYED_RELEASE_TABLET | Freq: Two times a day (BID) | ORAL | 0 refills | Status: AC

## 2018-01-05 MED ORDER — TRANEXAMIC ACID 1000 MG/10ML IV SOLN
1000.0000 mg | INTRAVENOUS | Status: DC
  Filled 2018-01-05: qty 10

## 2018-01-05 MED ORDER — VANCOMYCIN HCL 1000 MG IV SOLR
INTRAVENOUS | Status: DC | PRN
  Administered 2018-01-05: 1000 mg

## 2018-01-05 MED ORDER — MIDAZOLAM HCL 5 MG/5ML IJ SOLN
INTRAMUSCULAR | Status: DC | PRN
  Administered 2018-01-05 (×2): 1 mg via INTRAVENOUS

## 2018-01-05 MED ORDER — HYDROMORPHONE HCL 1 MG/ML IJ SOLN
INTRAMUSCULAR | Status: AC
  Administered 2018-01-05: 0.5 mg via INTRAVENOUS
  Filled 2018-01-05: qty 1

## 2018-01-05 MED ORDER — FENTANYL CITRATE (PF) 250 MCG/5ML IJ SOLN
INTRAMUSCULAR | Status: AC
  Filled 2018-01-05: qty 5

## 2018-01-05 MED ORDER — ESMOLOL HCL 100 MG/10ML IV SOLN
INTRAVENOUS | Status: DC | PRN
  Administered 2018-01-05 (×4): 10 mg via INTRAVENOUS
  Administered 2018-01-05: 40 mg via INTRAVENOUS
  Administered 2018-01-05 (×3): 10 mg via INTRAVENOUS

## 2018-01-05 MED ORDER — LIDOCAINE HCL (CARDIAC) 20 MG/ML IV SOLN
INTRAVENOUS | Status: DC | PRN
  Administered 2018-01-05: 100 mg via INTRAVENOUS

## 2018-01-05 MED ORDER — 0.9 % SODIUM CHLORIDE (POUR BTL) OPTIME
TOPICAL | Status: DC | PRN
  Administered 2018-01-05: 1000 mL

## 2018-01-05 MED ORDER — METHOCARBAMOL 1000 MG/10ML IJ SOLN
500.0000 mg | Freq: Four times a day (QID) | INTRAVENOUS | Status: DC | PRN
  Filled 2018-01-05: qty 5

## 2018-01-05 MED ORDER — OXYCODONE-ACETAMINOPHEN 5-325 MG PO TABS: 1.0000 | ORAL_TABLET | ORAL | 0 refills | Status: DC | PRN

## 2018-01-05 MED ORDER — PROPOFOL 500 MG/50ML IV EMUL
INTRAVENOUS | Status: DC | PRN
  Administered 2018-01-05: 25 ug/kg/min via INTRAVENOUS

## 2018-01-05 MED ORDER — FENTANYL CITRATE (PF) 250 MCG/5ML IJ SOLN
INTRAMUSCULAR | Status: AC
Start: 2018-01-05 — End: 2018-01-05
  Filled 2018-01-05: qty 5

## 2018-01-05 MED ORDER — POLYETHYLENE GLYCOL 3350 17 G PO PACK: 17.0000 g | PACK | Freq: Every day | ORAL | Status: DC | PRN

## 2018-01-05 MED ORDER — SULFAMETHOXAZOLE-TRIMETHOPRIM 800-160 MG PO TABS
1.0000 | ORAL_TABLET | Freq: Two times a day (BID) | ORAL | Status: DC
  Administered 2018-01-05 – 2018-01-06 (×3): 1 via ORAL
  Filled 2018-01-05 (×3): qty 1

## 2018-01-05 MED ORDER — ONDANSETRON HCL 4 MG/2ML IJ SOLN
4.0000 mg | Freq: Once | INTRAMUSCULAR | Status: AC | PRN
  Administered 2018-01-05: 4 mg via INTRAVENOUS

## 2018-01-05 MED ORDER — OXYCODONE HCL 5 MG PO TABS
ORAL_TABLET | ORAL | Status: AC
  Filled 2018-01-05: qty 1

## 2018-01-05 MED ORDER — ONDANSETRON HCL 4 MG/2ML IJ SOLN
INTRAMUSCULAR | Status: AC
  Filled 2018-01-05: qty 2

## 2018-01-05 MED ORDER — VANCOMYCIN HCL 1000 MG IV SOLR
INTRAVENOUS | Status: AC
  Filled 2018-01-05: qty 1000

## 2018-01-05 MED ORDER — ROCURONIUM BROMIDE 100 MG/10ML IV SOLN
INTRAVENOUS | Status: DC | PRN
  Administered 2018-01-05: 55 mg via INTRAVENOUS
  Administered 2018-01-05 (×2): 5 mg via INTRAVENOUS
  Administered 2018-01-05: 10 mg via INTRAVENOUS
  Administered 2018-01-05: 20 mg via INTRAVENOUS

## 2018-01-05 MED ORDER — HYDRALAZINE HCL 20 MG/ML IJ SOLN
INTRAMUSCULAR | Status: AC
  Administered 2018-01-05: 10 mg via INTRAVENOUS
  Filled 2018-01-05: qty 1

## 2018-01-05 MED ORDER — ONDANSETRON HCL 4 MG PO TABS: 4.0000 mg | ORAL_TABLET | Freq: Three times a day (TID) | ORAL | 0 refills | Status: AC | PRN

## 2018-01-05 MED ORDER — VANCOMYCIN HCL 500 MG IV SOLR
INTRAVENOUS | Status: AC
  Filled 2018-01-05: qty 500

## 2018-01-05 MED ORDER — PROPOFOL 10 MG/ML IV BOLUS
INTRAVENOUS | Status: AC
  Filled 2018-01-05: qty 40

## 2018-01-05 MED ORDER — HYDROMORPHONE HCL 1 MG/ML IJ SOLN
0.2500 mg | INTRAMUSCULAR | Status: DC | PRN
  Administered 2018-01-05 (×4): 0.5 mg via INTRAVENOUS

## 2018-01-05 MED ORDER — SENNOSIDES-DOCUSATE SODIUM 8.6-50 MG PO TABS: 1.0000 | ORAL_TABLET | Freq: Every evening | ORAL | 1 refills | Status: AC | PRN

## 2018-01-05 MED ORDER — PROMETHAZINE HCL 25 MG PO TABS: 25.0000 mg | ORAL_TABLET | Freq: Four times a day (QID) | ORAL | 1 refills | Status: AC | PRN

## 2018-01-05 MED ORDER — ZINC SULFATE 220 (50 ZN) MG PO CAPS: 220.0000 mg | ORAL_CAPSULE | Freq: Every day | ORAL | 0 refills | Status: AC

## 2018-01-05 MED ORDER — PROPOFOL 10 MG/ML IV BOLUS
INTRAVENOUS | Status: DC | PRN
  Administered 2018-01-05: 200 mg via INTRAVENOUS
  Administered 2018-01-05: 20 mg via INTRAVENOUS

## 2018-01-05 MED ORDER — SULFAMETHOXAZOLE-TRIMETHOPRIM 800-160 MG PO TABS: 1.0000 | ORAL_TABLET | Freq: Two times a day (BID) | ORAL | 0 refills | Status: AC

## 2018-01-05 MED ORDER — SORBITOL 70 % SOLN: 30.0000 mL | Freq: Every day | Status: DC | PRN

## 2018-01-05 MED ORDER — MAGNESIUM CITRATE PO SOLN: 1.0000 | Freq: Once | ORAL | Status: DC | PRN

## 2018-01-05 MED ORDER — DEXAMETHASONE SODIUM PHOSPHATE 10 MG/ML IJ SOLN
INTRAMUSCULAR | Status: DC | PRN
  Administered 2018-01-05: 10 mg via INTRAVENOUS

## 2018-01-05 MED ORDER — PROPOFOL 1000 MG/100ML IV EMUL
INTRAVENOUS | Status: AC
Start: 2018-01-05 — End: 2018-01-05
  Filled 2018-01-05: qty 200

## 2018-01-05 MED ORDER — ACETAMINOPHEN 650 MG RE SUPP: 650.0000 mg | RECTAL | Status: DC | PRN

## 2018-01-05 MED ORDER — KETOROLAC TROMETHAMINE 30 MG/ML IJ SOLN
30.0000 mg | Freq: Four times a day (QID) | INTRAMUSCULAR | Status: DC
  Administered 2018-01-05: 30 mg via INTRAVENOUS
  Filled 2018-01-05: qty 1

## 2018-01-05 MED ORDER — CALCIUM CARBONATE-VITAMIN D 500-200 MG-UNIT PO TABS: 1.0000 | ORAL_TABLET | Freq: Three times a day (TID) | ORAL | 12 refills | Status: AC

## 2018-01-05 MED ORDER — DIPHENHYDRAMINE HCL 12.5 MG/5ML PO ELIX: 25.0000 mg | ORAL_SOLUTION | ORAL | Status: DC | PRN

## 2018-01-05 MED ORDER — ONDANSETRON HCL 4 MG PO TABS: 4.0000 mg | ORAL_TABLET | Freq: Four times a day (QID) | ORAL | Status: DC | PRN

## 2018-01-05 MED ORDER — ONDANSETRON HCL 4 MG/2ML IJ SOLN
INTRAMUSCULAR | Status: DC | PRN
  Administered 2018-01-05: 4 mg via INTRAVENOUS

## 2018-01-05 MED ORDER — LACTATED RINGERS IV SOLN
INTRAVENOUS | Status: DC | PRN
  Administered 2018-01-05 (×3): via INTRAVENOUS

## 2018-01-05 MED ORDER — SUGAMMADEX SODIUM 200 MG/2ML IV SOLN
INTRAVENOUS | Status: DC | PRN
  Administered 2018-01-05: 200 mg via INTRAVENOUS

## 2018-01-05 MED ORDER — ASPIRIN EC 325 MG PO TBEC
325.0000 mg | DELAYED_RELEASE_TABLET | Freq: Two times a day (BID) | ORAL | Status: DC
  Administered 2018-01-05 – 2018-01-06 (×3): 325 mg via ORAL
  Filled 2018-01-05 (×3): qty 1

## 2018-01-05 MED ORDER — ACETAMINOPHEN 325 MG PO TABS: 650.0000 mg | ORAL_TABLET | ORAL | Status: DC | PRN

## 2018-01-05 SURGICAL SUPPLY — 80 items
BANDAGE ACE 4X5 VEL STRL LF (GAUZE/BANDAGES/DRESSINGS) ×3 IMPLANT
BANDAGE ACE 6X5 VEL STRL LF (GAUZE/BANDAGES/DRESSINGS) ×3 IMPLANT
BANDAGE ELASTIC 6 VELCRO ST LF (GAUZE/BANDAGES/DRESSINGS) ×3 IMPLANT
BANDAGE ESMARK 6X9 LF (GAUZE/BANDAGES/DRESSINGS) ×1 IMPLANT
BIT DRILL 2.5 (BIT) ×2
BIT DRILL 2.5MM (BIT) ×1
BIT DRILL 3.1 (BIT) ×2 IMPLANT
BIT DRILL 3.1MM (BIT) ×1
BIT DRILL AO GAMMA 4.2X130 (BIT) ×6 IMPLANT
BIT DRILL AO GAMMA 4.2X180 (BIT) ×3 IMPLANT
BIT DRILL AO GAMMA 4.2X340 (BIT) ×3 IMPLANT
BIT DRILL SHRT 216X2.5XNS LF (BIT) ×1 IMPLANT
BIT DRL SHRT 216X2.5XNS LF (BIT) ×1
BNDG CMPR 9X6 STRL LF SNTH (GAUZE/BANDAGES/DRESSINGS) ×1
BNDG ESMARK 6X9 LF (GAUZE/BANDAGES/DRESSINGS) ×3
COVER MAYO STAND STRL (DRAPES) ×3 IMPLANT
COVER SURGICAL LIGHT HANDLE (MISCELLANEOUS) ×3 IMPLANT
CUFF TOURNIQUET SINGLE 34IN LL (TOURNIQUET CUFF) ×3 IMPLANT
DRAPE C-ARM 42X72 X-RAY (DRAPES) ×3 IMPLANT
DRAPE C-ARMOR (DRAPES) ×3 IMPLANT
DRAPE HALF SHEET 40X57 (DRAPES) ×6 IMPLANT
DRAPE IMP U-DRAPE 54X76 (DRAPES) ×6 IMPLANT
DRAPE POUCH INSTRU U-SHP 10X18 (DRAPES) ×3 IMPLANT
DRAPE U-SHAPE 47X51 STRL (DRAPES) ×3 IMPLANT
DRAPE UTILITY XL STRL (DRAPES) ×6 IMPLANT
DURAPREP 26ML APPLICATOR (WOUND CARE) ×3 IMPLANT
ELECT CAUTERY BLADE 6.4 (BLADE) ×3 IMPLANT
ELECT REM PT RETURN 9FT ADLT (ELECTROSURGICAL) ×3
ELECTRODE REM PT RTRN 9FT ADLT (ELECTROSURGICAL) ×1 IMPLANT
FACESHIELD WRAPAROUND (MASK) IMPLANT
GAUZE SPONGE 4X4 12PLY STRL (GAUZE/BANDAGES/DRESSINGS) ×3 IMPLANT
GAUZE SPONGE 4X4 16PLY XRAY LF (GAUZE/BANDAGES/DRESSINGS) ×3 IMPLANT
GAUZE XEROFORM 1X8 LF (GAUZE/BANDAGES/DRESSINGS) ×6 IMPLANT
GAUZE XEROFORM 5X9 LF (GAUZE/BANDAGES/DRESSINGS) ×6 IMPLANT
GLOVE SKINSENSE NS SZ7.5 (GLOVE) ×8
GLOVE SKINSENSE STRL SZ7.5 (GLOVE) ×4 IMPLANT
GOWN STRL REIN XL XLG (GOWN DISPOSABLE) ×3 IMPLANT
GUIDEROD T2 3X1000 (ROD) ×3 IMPLANT
GUIDEROD T2 SMOOTHTIP 2.2X800 (ROD) ×3 IMPLANT
GUIDEWIRE GAMMA (WIRE) ×9 IMPLANT
GUIDEWIRE GAMMA SM TIP 3X800MM (WIRE) ×3 IMPLANT
K-WIRE FIXATION 3X285 COATED (WIRE) ×6
KIT BASIN OR (CUSTOM PROCEDURE TRAY) ×3 IMPLANT
KWIRE FIXATION 3X285 COATED (WIRE) ×2 IMPLANT
MANIFOLD NEPTUNE II (INSTRUMENTS) ×3 IMPLANT
NAIL ELAS INSERT SLV SPI 8-11 (MISCELLANEOUS) ×3 IMPLANT
NS IRRIG 1000ML POUR BTL (IV SOLUTION) ×3 IMPLANT
PACK TOTAL JOINT (CUSTOM PROCEDURE TRAY) ×3 IMPLANT
PACK UNIVERSAL I (CUSTOM PROCEDURE TRAY) ×3 IMPLANT
PAD ABD 8X10 STRL (GAUZE/BANDAGES/DRESSINGS) ×9 IMPLANT
PAD CAST 4YDX4 CTTN HI CHSV (CAST SUPPLIES) ×2 IMPLANT
PADDING CAST COTTON 4X4 STRL (CAST SUPPLIES) ×6
PADDING CAST COTTON 6X4 STRL (CAST SUPPLIES) ×6 IMPLANT
PADDING CAST SYNTHETIC 4 (CAST SUPPLIES) ×2
PADDING CAST SYNTHETIC 4X4 STR (CAST SUPPLIES) ×1 IMPLANT
PLATE 251 14H RT PROX TIBIAL (Plate) ×3 IMPLANT
REAMER SHAFT BIXCUT (INSTRUMENTS) ×3 IMPLANT
REAMER SURG 7.5X480 FEM BIXCUT (MISCELLANEOUS) ×3 IMPLANT
SCREW BONE 36X3.5MM (Screw) ×3 IMPLANT
SCREW BONE 38X3.5MM (Screw) ×3 IMPLANT
SCREW BONE 40X3.5MM (Screw) ×3 IMPLANT
SCREW CORT 34X3.5XST LCK NS (Screw) ×1 IMPLANT
SCREW CORTICAL 3.5MMX44MM (Screw) ×3 IMPLANT
SCREW CORTICAL 3.5X34MM (Screw) ×3 IMPLANT
SCREW LOCKING 4.0X70MM (Screw) ×3 IMPLANT
SCREW LOCKING 4.0X85 SELFTAP (Screw) ×3 IMPLANT
SCREW LOCKING 75X4.0MM (Screw) ×3 IMPLANT
SCREW LOCKING 80X4.0MM (Screw) ×3 IMPLANT
SHAFT REAMER  8X885MM (MISCELLANEOUS) ×2
SHAFT REAMER 8X885MM (MISCELLANEOUS) ×1 IMPLANT
STAPLER SKIN PROX WIDE 3.9 (STAPLE) ×9 IMPLANT
SUT VIC AB 0 CT1 27 (SUTURE) ×9
SUT VIC AB 0 CT1 27XBRD ANBCTR (SUTURE) ×1 IMPLANT
SUT VIC AB 0 CT1 27XBRD ANTBC (SUTURE) ×2 IMPLANT
SUT VIC AB 1 CT1 27 (SUTURE) ×9
SUT VIC AB 1 CT1 27XBRD ANBCTR (SUTURE) ×3 IMPLANT
SUT VIC AB 2-0 CT1 27 (SUTURE) ×12
SUT VIC AB 2-0 CT1 TAPERPNT 27 (SUTURE) ×4 IMPLANT
TOWEL OR 17X26 10 PK STRL BLUE (TOWEL DISPOSABLE) ×6 IMPLANT
WATER STERILE IRR 1000ML POUR (IV SOLUTION) ×3 IMPLANT

## 2018-01-05 NOTE — Anesthesia Postprocedure Evaluation (Signed)
Anesthesia Post Note  Patient: Thomas Webster  Procedure(s) Performed: INTRAMEDULLARY (IM) NAIL TIBIAL/FIBULA (Right Leg Upper)     Patient location during evaluation: PACU Anesthesia Type: General Level of consciousness: awake, awake and alert and oriented Vital Signs Assessment: post-procedure vital signs reviewed and stable Respiratory status: spontaneous breathing, nonlabored ventilation and respiratory function stable Cardiovascular status: blood pressure returned to baseline Postop Assessment: no headache Anesthetic complications: no    Last Vitals:  Vitals:   01/05/18 1300 01/05/18 1330  BP:  (!) 164/81  Pulse: 93 97  Resp: 20 18  Temp: (!) 36.1 C 37.1 C  SpO2: 99% 100%    Last Pain:  Vitals:   01/05/18 1500  TempSrc:   PainSc: 7                  Jameka Ivie COKER

## 2018-01-05 NOTE — Progress Notes (Signed)
Pt states he feels like he is loosing feeling in his Right leg. RN called MD waiting for call back will continue to monitor

## 2018-01-05 NOTE — Anesthesia Preprocedure Evaluation (Signed)
Anesthesia Evaluation  Patient identified by MRN, date of birth, ID band Patient awake    Reviewed: Allergy & Precautions, NPO status , Patient's Chart, lab work & pertinent test results  Airway Mallampati: II  TM Distance: >3 FB Neck ROM: Full    Dental  (+) Teeth Intact, Dental Advisory Given   Pulmonary    breath sounds clear to auscultation       Cardiovascular  Rhythm:Regular Rate:Normal     Neuro/Psych    GI/Hepatic   Endo/Other    Renal/GU      Musculoskeletal   Abdominal   Peds  Hematology   Anesthesia Other Findings   Reproductive/Obstetrics                             Anesthesia Physical Anesthesia Plan  ASA: II  Anesthesia Plan:    Post-op Pain Management:    Induction: Intravenous  PONV Risk Score and Plan: Ondansetron and Dexamethasone  Airway Management Planned: Oral ETT  Additional Equipment:   Intra-op Plan:   Post-operative Plan: Extubation in OR  Informed Consent: I have reviewed the patients History and Physical, chart, labs and discussed the procedure including the risks, benefits and alternatives for the proposed anesthesia with the patient or authorized representative who has indicated his/her understanding and acceptance.   Dental advisory given  Plan Discussed with: CRNA and Anesthesiologist  Anesthesia Plan Comments:         Anesthesia Quick Evaluation

## 2018-01-05 NOTE — Anesthesia Procedure Notes (Signed)
Procedure Name: Intubation Date/Time: 01/05/2018 7:57 AM Performed by: Wilder GladeWinn, Yurani Fettes G, CRNA Pre-anesthesia Checklist: Patient identified, Suction available, Patient being monitored, Timeout performed and Emergency Drugs available Patient Re-evaluated:Patient Re-evaluated prior to induction Oxygen Delivery Method: Circle system utilized Preoxygenation: Pre-oxygenation with 100% oxygen Induction Type: IV induction Ventilation: Mask ventilation without difficulty Laryngoscope Size: Miller and 2 Grade View: Grade I Tube type: Oral Tube size: 8.0 mm Number of attempts: 1 Airway Equipment and Method: Stylet Placement Confirmation: ETT inserted through vocal cords under direct vision,  positive ETCO2,  CO2 detector and breath sounds checked- equal and bilateral Secured at: 23 cm Tube secured with: Tape Dental Injury: Teeth and Oropharynx as per pre-operative assessment
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
## 2018-01-06 NOTE — Progress Notes (Signed)
I received call from RN stating patient refused lab draws this morning. He did well with PT and pain is well controlled.  Patient does not want to wait until I see him around noon to discharge.  Patient is stable for dc at this time.  Orders placed.  F/u in 2 weeks

## 2018-01-06 NOTE — Progress Notes (Signed)
Removed IV, Pt repeatedly stated that he wants to go home as soon as possible, provided discharge papers but Pt refused discharge education/instructions, said that he was previously hospitalized and already knows about the things he needs to do. Discharged home with belongings accompanied by family member.
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
## 2018-01-06 NOTE — Progress Notes (Signed)
Pt had been expressing that he wants to go home as soon as possible, refused AM labs, family member at bedside.MD notified.

## 2018-01-07 ENCOUNTER — Encounter (HOSPITAL_COMMUNITY): Payer: Self-pay | Admitting: Orthopaedic Surgery

## 2018-01-20 ENCOUNTER — Ambulatory Visit (INDEPENDENT_AMBULATORY_CARE_PROVIDER_SITE_OTHER): Payer: Self-pay

## 2018-01-20 ENCOUNTER — Ambulatory Visit (INDEPENDENT_AMBULATORY_CARE_PROVIDER_SITE_OTHER): Payer: Self-pay | Admitting: Orthopaedic Surgery

## 2018-01-20 ENCOUNTER — Encounter (INDEPENDENT_AMBULATORY_CARE_PROVIDER_SITE_OTHER): Payer: Self-pay | Admitting: Orthopaedic Surgery

## 2018-01-20 DIAGNOSIS — S82101D Unspecified fracture of upper end of right tibia, subsequent encounter for closed fracture with routine healing: Secondary | ICD-10-CM | POA: Insufficient documentation

## 2018-01-20 MED ORDER — OXYCODONE-ACETAMINOPHEN 5-325 MG PO TABS: ORAL_TABLET | ORAL | 0 refills | Status: DC
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
## 2018-02-17 ENCOUNTER — Ambulatory Visit (INDEPENDENT_AMBULATORY_CARE_PROVIDER_SITE_OTHER): Payer: Self-pay

## 2018-02-17 ENCOUNTER — Ambulatory Visit (INDEPENDENT_AMBULATORY_CARE_PROVIDER_SITE_OTHER): Payer: Self-pay | Admitting: Orthopaedic Surgery

## 2018-02-17 ENCOUNTER — Encounter (INDEPENDENT_AMBULATORY_CARE_PROVIDER_SITE_OTHER): Payer: Self-pay | Admitting: Orthopaedic Surgery

## 2018-02-17 DIAGNOSIS — M79604 Pain in right leg: Secondary | ICD-10-CM

## 2018-02-17 DIAGNOSIS — S82101D Unspecified fracture of upper end of right tibia, subsequent encounter for closed fracture with routine healing: Secondary | ICD-10-CM
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
## 2018-02-20 ENCOUNTER — Other Ambulatory Visit (INDEPENDENT_AMBULATORY_CARE_PROVIDER_SITE_OTHER): Payer: Self-pay

## 2018-02-20 ENCOUNTER — Telehealth (INDEPENDENT_AMBULATORY_CARE_PROVIDER_SITE_OTHER): Payer: Self-pay | Admitting: Orthopaedic Surgery

## 2018-02-20 ENCOUNTER — Ambulatory Visit: Payer: Self-pay | Attending: Orthopaedic Surgery | Admitting: Physical Therapy

## 2018-02-20 DIAGNOSIS — M25661 Stiffness of right knee, not elsewhere classified: Secondary | ICD-10-CM | POA: Insufficient documentation

## 2018-02-20 DIAGNOSIS — M6281 Muscle weakness (generalized): Secondary | ICD-10-CM | POA: Insufficient documentation

## 2018-02-20 DIAGNOSIS — M25561 Pain in right knee: Secondary | ICD-10-CM | POA: Insufficient documentation

## 2018-02-20 DIAGNOSIS — S82101D Unspecified fracture of upper end of right tibia, subsequent encounter for closed fracture with routine healing: Secondary | ICD-10-CM

## 2018-02-20 DIAGNOSIS — R262 Difficulty in walking, not elsewhere classified: Secondary | ICD-10-CM | POA: Insufficient documentation

## 2018-02-20 DIAGNOSIS — R600 Localized edema: Secondary | ICD-10-CM | POA: Insufficient documentation

## 2018-02-21 NOTE — Telephone Encounter (Signed)
ERROR

## 2018-02-25 ENCOUNTER — Ambulatory Visit: Payer: Self-pay | Admitting: Physical Therapy

## 2018-02-27 ENCOUNTER — Other Ambulatory Visit: Payer: Self-pay

## 2018-02-27 ENCOUNTER — Encounter: Payer: Self-pay | Admitting: Physical Therapy

## 2018-02-27 ENCOUNTER — Ambulatory Visit: Payer: Self-pay | Admitting: Physical Therapy

## 2018-02-27 DIAGNOSIS — M6281 Muscle weakness (generalized): Secondary | ICD-10-CM

## 2018-02-27 DIAGNOSIS — R600 Localized edema: Secondary | ICD-10-CM

## 2018-02-27 DIAGNOSIS — M25561 Pain in right knee: Secondary | ICD-10-CM

## 2018-02-27 DIAGNOSIS — M25661 Stiffness of right knee, not elsewhere classified: Secondary | ICD-10-CM

## 2018-02-27 DIAGNOSIS — R262 Difficulty in walking, not elsewhere classified: Secondary | ICD-10-CM

## 2018-02-27 NOTE — Patient Instructions (Signed)
     Copyright  VHI. All rights reserved.  HIP: Flexion / KNEE: Extension, Straight Leg Raise   Raise leg, keeping knee straight. Perform slowly. __5-10_ reps per set, __1_ sets per day, __7 days per week   Copyright  VHI. All rights reserved.  Heel Slide   Bend knee and pull heel toward buttocks. Hold ___5_ seconds. Return. Repeat with other knee. Repeat __5-10__ times. Do __3__ sessions per day.  http://gt2.exer.us/372   Copyright  VHI. All rights reserved.     Raise leg until knee is straight.  Pump ankle/foot every other repetition __5-10_ reps per set, _3__ sets per day, _7__ days per week  Copyright  VHI. All rights reserved.  Short Arc Arrow ElectronicsQuad   Place a large can or rolled towel under leg. Straighten knee and leg. Hold __3__ seconds. Repeat with other leg. Repeat ___5-10_ times. Do _3___ sessions per day.  http://gt2.exer.us/365   Copyright  VHI. All rights reserved.  Quad Set   Slowly tighten muscles on thigh of straight leg while counting out loud to __5__.  Can also do seated pushing heels into the floor.   Repeat _10___ times. Do __3__ sessions per day.  http://gt2.exer.us/361   Copyright  VHI. All rights reserved.    Thomas SharpsStacy Elmo Webster PT [facility] [address], [address] [place], [place] [address] Phone # [phone] Fax [phone]
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
## 2018-03-04 ENCOUNTER — Ambulatory Visit: Payer: Self-pay | Admitting: Physical Therapy

## 2018-03-04 DIAGNOSIS — M25561 Pain in right knee: Secondary | ICD-10-CM

## 2018-03-04 DIAGNOSIS — M6281 Muscle weakness (generalized): Secondary | ICD-10-CM

## 2018-03-04 DIAGNOSIS — M25661 Stiffness of right knee, not elsewhere classified: Secondary | ICD-10-CM

## 2018-03-04 DIAGNOSIS — R600 Localized edema: Secondary | ICD-10-CM

## 2018-03-04 DIAGNOSIS — R262 Difficulty in walking, not elsewhere classified: Secondary | ICD-10-CM
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
## 2018-03-05 ENCOUNTER — Ambulatory Visit: Payer: Self-pay | Admitting: Physical Therapy

## 2018-03-10 ENCOUNTER — Telehealth: Payer: Self-pay | Admitting: Physical Therapy

## 2018-03-10 ENCOUNTER — Ambulatory Visit: Payer: Self-pay | Admitting: Physical Therapy

## 2018-03-10 NOTE — Telephone Encounter (Signed)
Left message for pt to call office due to no show x 2.  If pt doesn't show for next appt will cancel remaining and d/c per policy.  Clarita CraneStephanie F Rane Blitch, PT, DPT 03/10/18 3:11 PM

## 2018-03-13 ENCOUNTER — Ambulatory Visit: Payer: Self-pay | Admitting: Physical Therapy

## 2018-03-17 ENCOUNTER — Encounter: Payer: Self-pay | Admitting: Physical Therapy

## 2018-03-19 ENCOUNTER — Encounter: Payer: Self-pay | Admitting: Physical Therapy

## 2018-03-24 ENCOUNTER — Encounter: Payer: Self-pay | Admitting: Physical Therapy

## 2018-03-26 ENCOUNTER — Encounter: Payer: Self-pay | Admitting: Physical Therapy

## 2018-04-03 ENCOUNTER — Ambulatory Visit (INDEPENDENT_AMBULATORY_CARE_PROVIDER_SITE_OTHER): Payer: Self-pay | Admitting: Orthopaedic Surgery

## 2020-02-23 ENCOUNTER — Ambulatory Visit (HOSPITAL_COMMUNITY)
Admission: EM | Admit: 2020-02-23 | Discharge: 2020-02-23 | Disposition: A | Discharge status: Home / Self Care | Payer: Self-pay | Attending: Family Medicine | Admitting: Family Medicine

## 2020-02-23 ENCOUNTER — Other Ambulatory Visit: Payer: Self-pay

## 2020-02-23 ENCOUNTER — Ambulatory Visit (INDEPENDENT_AMBULATORY_CARE_PROVIDER_SITE_OTHER): Payer: Self-pay

## 2020-02-23 ENCOUNTER — Ambulatory Visit (HOSPITAL_COMMUNITY): Payer: Self-pay

## 2020-02-23 ENCOUNTER — Encounter (HOSPITAL_COMMUNITY): Payer: Self-pay

## 2020-02-23 DIAGNOSIS — M25531 Pain in right wrist: Secondary | ICD-10-CM

## 2020-02-23 MED ORDER — PREDNISONE 10 MG (21) PO TBPK: ORAL_TABLET | Freq: Every day | ORAL | 0 refills | Status: AC
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
## 2021-04-19 ENCOUNTER — Emergency Department (HOSPITAL_COMMUNITY)
Admission: EM | Admit: 2021-04-19 | Discharge: 2021-04-19 | Disposition: A | Discharge status: Home / Self Care | Payer: No Typology Code available for payment source | Attending: Emergency Medicine | Admitting: Emergency Medicine

## 2021-04-19 ENCOUNTER — Other Ambulatory Visit: Payer: Self-pay

## 2021-04-19 ENCOUNTER — Emergency Department (HOSPITAL_COMMUNITY): Payer: No Typology Code available for payment source

## 2021-04-19 DIAGNOSIS — S40011A Contusion of right shoulder, initial encounter: Secondary | ICD-10-CM

## 2021-04-19 DIAGNOSIS — Z7982 Long term (current) use of aspirin: Secondary | ICD-10-CM | POA: Diagnosis not present

## 2021-04-19 DIAGNOSIS — M25511 Pain in right shoulder: Secondary | ICD-10-CM | POA: Insufficient documentation

## 2021-04-19 DIAGNOSIS — S20211A Contusion of right front wall of thorax, initial encounter: Secondary | ICD-10-CM | POA: Diagnosis not present

## 2021-04-19 DIAGNOSIS — S299XXA Unspecified injury of thorax, initial encounter: Secondary | ICD-10-CM | POA: Diagnosis present

## 2021-04-19 DIAGNOSIS — S2231XA Fracture of one rib, right side, initial encounter for closed fracture: Secondary | ICD-10-CM

## 2021-04-19 DIAGNOSIS — M545 Low back pain, unspecified: Secondary | ICD-10-CM | POA: Insufficient documentation

## 2021-04-19 DIAGNOSIS — Y92410 Unspecified street and highway as the place of occurrence of the external cause: Secondary | ICD-10-CM | POA: Diagnosis not present

## 2021-04-19 MED ORDER — IBUPROFEN 400 MG PO TABS
400.0000 mg | ORAL_TABLET | Freq: Once | ORAL | Status: AC
Start: 2021-04-19 — End: 2021-04-19
  Administered 2021-04-19: 400 mg via ORAL
  Filled 2021-04-19: qty 1

## 2021-04-19 MED ORDER — ACETAMINOPHEN 500 MG PO TABS
1000.0000 mg | ORAL_TABLET | Freq: Once | ORAL | Status: AC
Start: 2021-04-19 — End: 2021-04-19
  Administered 2021-04-19: 1000 mg via ORAL
  Filled 2021-04-19: qty 2

## 2021-04-19 MED ORDER — TRAMADOL HCL 50 MG PO TABS: 50.0000 mg | ORAL_TABLET | Freq: Four times a day (QID) | ORAL | 0 refills | Status: AC | PRN
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
## 2021-04-19 NOTE — Discharge Instructions (Signed)
It was our pleasure to provide your ER care today - we hope that you feel better.  Your xrays show a possible 8th rib fracture - see attached information.    Take acetaminophen or ibuprofen as need. You may also take ultram as need for pain - no driving when taking.   Take full and deep breaths, stay active.   Return to ER if worse, new symptoms, new or severe pain, severe headache, abdominal pain, trouble breathing, fevers, or other concern.
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
## 2021-05-03 ENCOUNTER — Emergency Department (HOSPITAL_COMMUNITY): Payer: No Typology Code available for payment source

## 2021-05-03 ENCOUNTER — Emergency Department (HOSPITAL_COMMUNITY)
Admission: EM | Admit: 2021-05-03 | Discharge: 2021-05-03 | Disposition: A | Discharge status: Home / Self Care | Payer: No Typology Code available for payment source | Attending: Emergency Medicine | Admitting: Emergency Medicine

## 2021-05-03 ENCOUNTER — Encounter (HOSPITAL_COMMUNITY): Payer: Self-pay | Admitting: Emergency Medicine

## 2021-05-03 DIAGNOSIS — R0781 Pleurodynia: Secondary | ICD-10-CM | POA: Insufficient documentation

## 2021-05-03 DIAGNOSIS — M545 Low back pain, unspecified: Secondary | ICD-10-CM | POA: Diagnosis not present

## 2021-05-03 DIAGNOSIS — Y92410 Unspecified street and highway as the place of occurrence of the external cause: Secondary | ICD-10-CM | POA: Insufficient documentation

## 2021-05-03 DIAGNOSIS — M546 Pain in thoracic spine: Secondary | ICD-10-CM | POA: Insufficient documentation

## 2021-05-03 DIAGNOSIS — R0789 Other chest pain: Secondary | ICD-10-CM | POA: Diagnosis not present

## 2021-05-03 DIAGNOSIS — M79604 Pain in right leg: Secondary | ICD-10-CM | POA: Diagnosis present

## 2021-05-03 MED ORDER — LIDOCAINE 5 % EX PTCH: 1.0000 | MEDICATED_PATCH | CUTANEOUS | Status: DC
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
## 2023-03-05 ENCOUNTER — Other Ambulatory Visit: Payer: Self-pay

## 2023-03-05 DIAGNOSIS — K047 Periapical abscess without sinus: Secondary | ICD-10-CM | POA: Insufficient documentation

## 2023-03-05 DIAGNOSIS — M79604 Pain in right leg: Secondary | ICD-10-CM | POA: Insufficient documentation

## 2023-03-06 ENCOUNTER — Other Ambulatory Visit: Payer: Self-pay

## 2023-03-06 ENCOUNTER — Emergency Department (HOSPITAL_BASED_OUTPATIENT_CLINIC_OR_DEPARTMENT_OTHER)
Admission: EM | Admit: 2023-03-06 | Discharge: 2023-03-06 | Disposition: A | Discharge status: Home / Self Care | Payer: Self-pay | Attending: Emergency Medicine | Admitting: Emergency Medicine

## 2023-03-06 DIAGNOSIS — K047 Periapical abscess without sinus: Secondary | ICD-10-CM

## 2023-03-06 DIAGNOSIS — M79604 Pain in right leg: Secondary | ICD-10-CM

## 2023-03-06 MED ORDER — CEPHALEXIN 500 MG PO CAPS: 500.0000 mg | ORAL_CAPSULE | Freq: Three times a day (TID) | ORAL | 0 refills | Status: AC
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
## 2023-03-06 NOTE — ED Notes (Signed)
Reviewed AVS/discharge instruction with patient. Time allotted for and all questions answered. Patient is agreeable for d/c and escorted to ed exit by staff.  

## 2023-03-06 NOTE — Discharge Instructions (Addendum)
You were evaluated in the Emergency Department and after careful evaluation, we did not find any emergent condition requiring admission or further testing in the hospital.  Your exam/testing today is overall reassuring.  Symptoms seem to be due to an infection.  Take the Keflex antibiotic as directed.  Follow-up with the general surgeons as we discussed.  Also recommend follow-up with a dentist.  Please return to the Emergency Department if you experience any worsening of your condition.   Thank you for allowing [place] to be a part of your care.
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
